# Patient Record
Sex: Female | Born: 1935 | Race: Black or African American | Hispanic: No | Smoking: Never smoker
Health system: Southern US, Community
[De-identification: ages and names within clinical notes are randomized; demographics above are authoritative.]

## PROBLEM LIST (undated history)

## (undated) DIAGNOSIS — I2510 Atherosclerotic heart disease of native coronary artery without angina pectoris: Secondary | ICD-10-CM

## (undated) DIAGNOSIS — I10 Essential (primary) hypertension: Secondary | ICD-10-CM

## (undated) HISTORY — PX: CARDIAC SURGERY: SHX584

---

## 2019-10-26 ENCOUNTER — Emergency Department (HOSPITAL_COMMUNITY): Payer: Medicare Other

## 2019-10-26 ENCOUNTER — Other Ambulatory Visit: Payer: Self-pay

## 2019-10-26 ENCOUNTER — Encounter (HOSPITAL_COMMUNITY): Payer: Self-pay | Admitting: *Deleted

## 2019-10-26 ENCOUNTER — Observation Stay (HOSPITAL_COMMUNITY)
Admission: EM | Admit: 2019-10-26 | Discharge: 2019-10-28 | Disposition: A | Discharge status: Home Health | Payer: Medicare Other | Attending: Internal Medicine | Admitting: Internal Medicine

## 2019-10-26 DIAGNOSIS — I351 Nonrheumatic aortic (valve) insufficiency: Secondary | ICD-10-CM | POA: Diagnosis not present

## 2019-10-26 DIAGNOSIS — I160 Hypertensive urgency: Secondary | ICD-10-CM | POA: Diagnosis not present

## 2019-10-26 DIAGNOSIS — R531 Weakness: Secondary | ICD-10-CM | POA: Insufficient documentation

## 2019-10-26 DIAGNOSIS — R2681 Unsteadiness on feet: Secondary | ICD-10-CM | POA: Diagnosis not present

## 2019-10-26 DIAGNOSIS — Z20822 Contact with and (suspected) exposure to covid-19: Secondary | ICD-10-CM | POA: Insufficient documentation

## 2019-10-26 DIAGNOSIS — I2510 Atherosclerotic heart disease of native coronary artery without angina pectoris: Secondary | ICD-10-CM | POA: Insufficient documentation

## 2019-10-26 DIAGNOSIS — N170 Acute kidney failure with tubular necrosis: Secondary | ICD-10-CM | POA: Insufficient documentation

## 2019-10-26 DIAGNOSIS — R519 Headache, unspecified: Secondary | ICD-10-CM | POA: Insufficient documentation

## 2019-10-26 DIAGNOSIS — I161 Hypertensive emergency: Secondary | ICD-10-CM

## 2019-10-26 DIAGNOSIS — Z79899 Other long term (current) drug therapy: Secondary | ICD-10-CM | POA: Diagnosis not present

## 2019-10-26 DIAGNOSIS — R002 Palpitations: Secondary | ICD-10-CM | POA: Diagnosis not present

## 2019-10-26 DIAGNOSIS — N183 Chronic kidney disease, stage 3 unspecified: Secondary | ICD-10-CM | POA: Insufficient documentation

## 2019-10-26 DIAGNOSIS — I129 Hypertensive chronic kidney disease with stage 1 through stage 4 chronic kidney disease, or unspecified chronic kidney disease: Secondary | ICD-10-CM | POA: Diagnosis not present

## 2019-10-26 DIAGNOSIS — Z8679 Personal history of other diseases of the circulatory system: Secondary | ICD-10-CM

## 2019-10-26 HISTORY — DX: Essential (primary) hypertension: I10

## 2019-10-26 HISTORY — DX: Atherosclerotic heart disease of native coronary artery without angina pectoris: I25.10

## 2019-10-26 LAB — CBC WITH DIFFERENTIAL/PLATELET
Abs Immature Granulocytes: 0.02 10*3/uL (ref 0.00–0.07)
Basophils Absolute: 0 10*3/uL (ref 0.0–0.1)
Basophils Relative: 0 %
Eosinophils Absolute: 0 10*3/uL (ref 0.0–0.5)
Eosinophils Relative: 0 %
HCT: 36.2 % (ref 36.0–46.0)
Hemoglobin: 11.4 g/dL — ABNORMAL LOW (ref 12.0–15.0)
Immature Granulocytes: 0 %
Lymphocytes Relative: 19 %
Lymphs Abs: 1.2 10*3/uL (ref 0.7–4.0)
MCH: 23.4 pg — ABNORMAL LOW (ref 26.0–34.0)
MCHC: 31.5 g/dL (ref 30.0–36.0)
MCV: 74.2 fL — ABNORMAL LOW (ref 80.0–100.0)
Monocytes Absolute: 0.3 10*3/uL (ref 0.1–1.0)
Monocytes Relative: 5 %
Neutro Abs: 4.8 10*3/uL (ref 1.7–7.7)
Neutrophils Relative %: 76 %
Platelets: 165 10*3/uL (ref 150–400)
RBC: 4.88 MIL/uL (ref 3.87–5.11)
RDW: 18.4 % — ABNORMAL HIGH (ref 11.5–15.5)
WBC: 6.3 10*3/uL (ref 4.0–10.5)
nRBC: 0 % (ref 0.0–0.2)

## 2019-10-26 LAB — COMPREHENSIVE METABOLIC PANEL
ALT: 14 U/L (ref 0–44)
AST: 20 U/L (ref 15–41)
Albumin: 3.7 g/dL (ref 3.5–5.0)
Alkaline Phosphatase: 63 U/L (ref 38–126)
Anion gap: 10 (ref 5–15)
BUN: 37 mg/dL — ABNORMAL HIGH (ref 8–23)
CO2: 27 mmol/L (ref 22–32)
Calcium: 9.5 mg/dL (ref 8.9–10.3)
Chloride: 105 mmol/L (ref 98–111)
Creatinine, Ser: 1.45 mg/dL — ABNORMAL HIGH (ref 0.44–1.00)
GFR calc Af Amer: 39 mL/min — ABNORMAL LOW (ref >60)
GFR calc non Af Amer: 33 mL/min — ABNORMAL LOW (ref >60)
Glucose, Bld: 120 mg/dL — ABNORMAL HIGH (ref 70–99)
Potassium: 3.5 mmol/L (ref 3.5–5.1)
Sodium: 142 mmol/L (ref 135–145)
Total Bilirubin: 1.2 mg/dL (ref 0.3–1.2)
Total Protein: 7.9 g/dL (ref 6.5–8.1)

## 2019-10-26 LAB — URINALYSIS, ROUTINE W REFLEX MICROSCOPIC
Bilirubin Urine: NEGATIVE
Glucose, UA: NEGATIVE mg/dL
Hgb urine dipstick: NEGATIVE
Ketones, ur: 5 mg/dL — AB
Nitrite: NEGATIVE
Protein, ur: NEGATIVE mg/dL
Specific Gravity, Urine: 1.004 — ABNORMAL LOW (ref 1.005–1.030)
pH: 5 (ref 5.0–8.0)

## 2019-10-26 LAB — BRAIN NATRIURETIC PEPTIDE
B Natriuretic Peptide: 155.6 pg/mL — ABNORMAL HIGH (ref 0.0–100.0)
B Natriuretic Peptide: 359.8 pg/mL — ABNORMAL HIGH (ref 0.0–100.0)

## 2019-10-26 LAB — RESPIRATORY PANEL BY RT PCR (FLU A&B, COVID)
Influenza A by PCR: NEGATIVE
Influenza B by PCR: NEGATIVE
SARS Coronavirus 2 by RT PCR: NEGATIVE

## 2019-10-26 LAB — LIPASE, BLOOD: Lipase: 21 U/L (ref 11–51)

## 2019-10-26 LAB — MAGNESIUM: Magnesium: 2.3 mg/dL (ref 1.7–2.4)

## 2019-10-26 LAB — PROTIME-INR
INR: 1 (ref 0.8–1.2)
INR: 1 (ref 0.8–1.2)
Prothrombin Time: 12.3 seconds (ref 11.4–15.2)
Prothrombin Time: 12.9 seconds (ref 11.4–15.2)

## 2019-10-26 LAB — TROPONIN I (HIGH SENSITIVITY)
Troponin I (High Sensitivity): 52 ng/L — ABNORMAL HIGH (ref <18)
Troponin I (High Sensitivity): 64 ng/L — ABNORMAL HIGH (ref <18)

## 2019-10-26 MED ORDER — FUROSEMIDE 40 MG PO TABS
40.0000 mg | ORAL_TABLET | Freq: Every day | ORAL | Status: DC
  Administered 2019-10-27 – 2019-10-28 (×2): 40 mg via ORAL
  Filled 2019-10-26: qty 2
  Filled 2019-10-26: qty 1

## 2019-10-26 MED ORDER — MORPHINE SULFATE (PF) 4 MG/ML IV SOLN: 4.0000 mg | Freq: Once | INTRAVENOUS | Status: DC

## 2019-10-26 MED ORDER — ACETAMINOPHEN 325 MG PO TABS: 650.0000 mg | ORAL_TABLET | Freq: Four times a day (QID) | ORAL | Status: DC | PRN

## 2019-10-26 MED ORDER — HYDRALAZINE HCL 50 MG PO TABS
50.0000 mg | ORAL_TABLET | Freq: Three times a day (TID) | ORAL | Status: DC
  Administered 2019-10-26 – 2019-10-28 (×6): 50 mg via ORAL
  Filled 2019-10-26: qty 1
  Filled 2019-10-26: qty 2
  Filled 2019-10-26 (×2): qty 1
  Filled 2019-10-26 (×2): qty 2

## 2019-10-26 MED ORDER — ACETAMINOPHEN 650 MG RE SUPP: 650.0000 mg | Freq: Four times a day (QID) | RECTAL | Status: DC | PRN

## 2019-10-26 MED ORDER — CLONIDINE HCL 0.2 MG PO TABS
0.2000 mg | ORAL_TABLET | Freq: Once | ORAL | Status: AC
  Administered 2019-10-26: 0.2 mg via ORAL
  Filled 2019-10-26: qty 1

## 2019-10-26 MED ORDER — AMLODIPINE BESYLATE 10 MG PO TABS
10.0000 mg | ORAL_TABLET | Freq: Every day | ORAL | Status: DC
  Administered 2019-10-26 – 2019-10-28 (×3): 10 mg via ORAL
  Filled 2019-10-26 (×2): qty 2
  Filled 2019-10-26: qty 1

## 2019-10-26 MED ORDER — ACETAMINOPHEN 500 MG PO TABS
1000.0000 mg | ORAL_TABLET | Freq: Once | ORAL | Status: AC
  Administered 2019-10-26: 1000 mg via ORAL
  Filled 2019-10-26: qty 2

## 2019-10-26 MED ORDER — NITROGLYCERIN IN D5W 200-5 MCG/ML-% IV SOLN
0.0000 ug/min | INTRAVENOUS | Status: DC
  Filled 2019-10-26: qty 250

## 2019-10-26 MED ORDER — HEPARIN SODIUM (PORCINE) 5000 UNIT/ML IJ SOLN
5000.0000 [IU] | Freq: Three times a day (TID) | INTRAMUSCULAR | Status: DC
  Administered 2019-10-26 – 2019-10-28 (×6): 5000 [IU] via SUBCUTANEOUS
  Filled 2019-10-26 (×6): qty 1

## 2019-10-26 MED ORDER — ONDANSETRON HCL 4 MG/2ML IJ SOLN
4.0000 mg | Freq: Four times a day (QID) | INTRAMUSCULAR | Status: DC | PRN
  Administered 2019-10-26: 4 mg via INTRAVENOUS
  Filled 2019-10-26: qty 2

## 2019-10-26 MED ORDER — HYDRALAZINE HCL 20 MG/ML IJ SOLN
20.0000 mg | Freq: Once | INTRAMUSCULAR | Status: AC
  Administered 2019-10-26: 20 mg via INTRAVENOUS
  Filled 2019-10-26: qty 1

## 2019-10-26 MED ORDER — HYDROCHLOROTHIAZIDE 25 MG PO TABS
25.0000 mg | ORAL_TABLET | Freq: Every day | ORAL | Status: DC
  Administered 2019-10-26 – 2019-10-27 (×2): 25 mg via ORAL
  Filled 2019-10-26 (×2): qty 1

## 2019-10-26 MED ORDER — NITROGLYCERIN IN D5W 200-5 MCG/ML-% IV SOLN: 0.0000 ug/min | INTRAVENOUS | Status: DC

## 2019-10-26 MED ORDER — IRBESARTAN 300 MG PO TABS
300.0000 mg | ORAL_TABLET | Freq: Every day | ORAL | Status: DC
  Administered 2019-10-27 – 2019-10-28 (×2): 300 mg via ORAL
  Filled 2019-10-26 (×2): qty 1

## 2019-10-26 MED ORDER — HYDROMORPHONE HCL 1 MG/ML IJ SOLN
1.0000 mg | Freq: Once | INTRAMUSCULAR | Status: AC
  Administered 2019-10-26: 1 mg via INTRAVENOUS
  Filled 2019-10-26: qty 1

## 2019-10-26 MED ORDER — MORPHINE SULFATE (PF) 4 MG/ML IV SOLN
4.0000 mg | Freq: Once | INTRAVENOUS | Status: AC
  Administered 2019-10-26: 4 mg via INTRAVENOUS
  Filled 2019-10-26: qty 1

## 2019-10-26 MED ORDER — ATORVASTATIN CALCIUM 40 MG PO TABS
40.0000 mg | ORAL_TABLET | Freq: Every day | ORAL | Status: DC
  Administered 2019-10-27 – 2019-10-28 (×2): 40 mg via ORAL
  Filled 2019-10-26 (×2): qty 1

## 2019-10-26 MED ORDER — HYDRALAZINE HCL 20 MG/ML IJ SOLN: 20.0000 mg | Freq: Four times a day (QID) | INTRAMUSCULAR | Status: DC | PRN

## 2019-10-26 MED ORDER — CLONIDINE HCL 0.1 MG PO TABS
0.1000 mg | ORAL_TABLET | Freq: Two times a day (BID) | ORAL | Status: DC
  Administered 2019-10-26 – 2019-10-28 (×4): 0.1 mg via ORAL
  Filled 2019-10-26 (×4): qty 1

## 2019-10-26 MED ORDER — LABETALOL HCL 5 MG/ML IV SOLN
10.0000 mg | INTRAVENOUS | Status: DC | PRN
  Administered 2019-10-26: 10 mg via INTRAVENOUS
  Filled 2019-10-26: qty 4

## 2019-10-26 MED ORDER — CLEVIDIPINE BUTYRATE 0.5 MG/ML IV EMUL
0.0000 mg/h | INTRAVENOUS | Status: DC
  Administered 2019-10-26: 1 mg/h via INTRAVENOUS
  Filled 2019-10-26: qty 50

## 2019-10-26 NOTE — ED Provider Notes (Signed)
[facility] EMERGENCY DEPARTMENT Provider Note   CSN: [id number] Arrival date & time: 10/26/19  1054     History Chief Complaint  Patient presents with  . Palpitations    Morgan Farmer is a 85 y.o. female.  HPI Patient's first complaint on arrival was for palpitations.  She reports she can hear feel her heart pounding hard and then racing at times.  She denies it is painful but she does perceive the sensation of pounding.  She denies being short of breath or feeling like she will pass out.  Patient was treated for hypertensive urgency at [facility] 5 days ago.  She was admitted overnight and put on nifedipine.  Patient reports she has been compliant with her medications since discharge.  Her daughter has come to the emergency department and reports that she has been taking her clonidine, hydralazine and valsartan as prescribed.  Patient reports she did take the a.m. doses of her medications as prescribed.  Shortly after initiating the patient's evaluation, she began complaining of a generalized headache.  She denied visual symptoms.  Her blood pressure continued to climb.  Patient had increasing complaint of headache.  She did not have associated nausea or vomiting.    Past Medical History:  Diagnosis Date  . Coronary artery disease   . Hypertension     There are no problems to display for this patient.      OB History   No obstetric history on file.     No family history on file.  Social History   Tobacco Use  . Smoking status: Never Smoker  . Smokeless tobacco: Never Used  Substance Use Topics  . Alcohol use: Never  . Drug use: Never    Home Medications Prior to Admission medications   Medication Sig Start Date End Date Taking? Authorizing Provider  ascorbic acid (VITAMIN C) 500 MG tablet Take 500-1,000 mg by mouth daily.   Yes [provider]  Cholecalciferol (VITAMIN D-3) 25 MCG (1000 UT) CAPS Take 1,000 Units by  mouth daily.   Yes [provider]  cloNIDine (CATAPRES) 0.1 MG tablet Take 0.1 mg by mouth 2 (two) times daily.   Yes [provider]  GARLIC PO Take 1 tablet by mouth daily.   Yes [provider]  hydrALAZINE (APRESOLINE) 50 MG tablet Take 50 mg by mouth 3 (three) times daily.   Yes [provider]  valsartan (DIOVAN) 320 MG tablet Take 320 mg by mouth daily.   Yes [provider]    Allergies    Patient has no known allergies.  Review of Systems   Review of Systems 10 Systems reviewed and are negative for acute change except as noted in the HPI.  Physical Exam Updated Vital Signs BP (!) 183/66   Pulse 69   Temp 98.2 F (36.8 C) (Oral)   Resp 14   Ht 5\' 2"  (1.575 m)   Wt 45.4 kg   SpO2 99%   BMI 18.29 kg/m   Physical Exam Constitutional:      Comments: Alert.  Mental status clear.  No respiratory distress at rest.  Very thin in appearance.  HENT:     Head: Normocephalic and atraumatic.     Mouth/Throat:     Mouth: Mucous membranes are moist.     Pharynx: Oropharynx is clear.     Comments: Poor dentition. Eyes:     Extraocular Movements: Extraocular movements intact.     Pupils: Pupils  are equal, round, and reactive to light.     Comments: Pupils are approximately 2 to 3 mm and symmetric.  Cardiovascular:     Comments: Heart regular with frequent ectopy.  Monitor shows sinus rhythm with combination of PACs and PVCs intermittently. Pulmonary:     Effort: Pulmonary effort is normal.     Breath sounds: Normal breath sounds.  Abdominal:     General: There is no distension.     Palpations: Abdomen is soft.     Tenderness: There is no abdominal tenderness. There is no guarding.  Musculoskeletal:        General: No swelling or tenderness. Normal range of motion.     Cervical back: Neck supple.     Right lower leg: No edema.     Left lower leg: No edema.  Skin:    General: Skin is warm and dry.  Neurological:     General:  No focal deficit present.     Mental Status: She is oriented to person, place, and time.     Cranial Nerves: No cranial nerve deficit.     Motor: No weakness.     Coordination: Coordination normal.  Psychiatric:        Mood and Affect: Mood normal.     ED Results / Procedures / Treatments   Labs (all labs ordered are listed, but only abnormal results are displayed) Labs Reviewed  BRAIN NATRIURETIC PEPTIDE - Abnormal; Notable for the following components:      Result Value   B Natriuretic Peptide 155.6 (*)    All other components within normal limits  CBC WITH DIFFERENTIAL/PLATELET - Abnormal; Notable for the following components:   Hemoglobin 11.4 (*)    MCV 74.2 (*)    MCH 23.4 (*)    RDW 18.4 (*)    All other components within normal limits  URINALYSIS, ROUTINE W REFLEX MICROSCOPIC - Abnormal; Notable for the following components:   Color, Urine STRAW (*)    Specific Gravity, Urine 1.004 (*)    Ketones, ur 5 (*)    Leukocytes,Ua TRACE (*)    Bacteria, UA RARE (*)    All other components within normal limits  BRAIN NATRIURETIC PEPTIDE - Abnormal; Notable for the following components:   B Natriuretic Peptide 359.8 (*)    All other components within normal limits  TROPONIN I (HIGH SENSITIVITY) - Abnormal; Notable for the following components:   Troponin I (High Sensitivity) 52 (*)    All other components within normal limits  RESPIRATORY PANEL BY RT PCR (FLU A&B, COVID)  PROTIME-INR  PROTIME-INR  COMPREHENSIVE METABOLIC PANEL  LIPASE, BLOOD  MAGNESIUM  TROPONIN I (HIGH SENSITIVITY)    EKG EKG Interpretation  Date/Time:  Wednesday October 26 2019 10:57:53 EDT Ventricular Rate:  83 PR Interval:    QRS Duration: 94 QT Interval:  372 QTC Calculation: 438 R Axis:   8 Text Interpretation: Sinus rhythm Supraventricular bigeminy Prolonged PR interval Anterior infarct, old no old comparison, no STEMI Confirmed by Arby Barrette [phone]) on 10/26/2019 2:37:56  PM   Radiology DG Chest Port 1 View  Result Date: 10/26/2019 CLINICAL DATA:  Shortness of breath, headache EXAM: PORTABLE CHEST 1 VIEW COMPARISON:  CT a chest 10/21/2019 FINDINGS: Heart size is markedly enlarged, accentuated by portable technique. Dilation of the ascending thoracic aorta with tortuosity of descending aorta similar to previous exam. Osteopenia. Glenohumeral degenerative changes. No dense consolidation, no sign of pleural effusion. IMPRESSION: 1. No active cardiopulmonary disease. 2. Stable cardiomegaly.  Electronically Signed   By: Donzetta Kohut M.D.   On: 10/26/2019 12:37    Procedures Procedures (including critical care time) CRITICAL CARE Performed by: Arby Barrette   Total critical care time: 60 minutes  Critical care time was exclusive of separately billable procedures and treating other patients.  Critical care was necessary to treat or prevent imminent or life-threatening deterioration.  Critical care was time spent personally by me on the following activities: development of treatment plan with patient and/or surrogate as well as nursing, discussions with consultants, evaluation of patient's response to treatment, examination of patient, obtaining history from patient or surrogate, ordering and performing treatments and interventions, ordering and review of laboratory studies, ordering and review of radiographic studies, pulse oximetry and re-evaluation of patient's condition.  Bedside monitor: Sinus rhythm with frequent PACs and intermittent PVCs.  Rates ranging from 70s to low 100s Medications Ordered in ED Medications  clevidipine (CLEVIPREX) infusion 0.5 mg/mL (1 mg/hr Intravenous New Bag/Given 10/26/19 1533)  hydrALAZINE (APRESOLINE) injection 20 mg (20 mg Intravenous Given 10/26/19 1449)  cloNIDine (CATAPRES) tablet 0.2 mg (0.2 mg Oral Given 10/26/19 1448)  acetaminophen (TYLENOL) tablet 1,000 mg (1,000 mg Oral Given 10/26/19 1448)  morphine 4 MG/ML injection  4 mg (4 mg Intravenous Given 10/26/19 1532)    ED Course  I have reviewed the triage vital signs and the nursing notes.  Pertinent labs & imaging results that were available during my care of the patient were reviewed by me and considered in my medical decision making (see chart for details).  Clinical Course as of Oct 26 1614  Wed Oct 26, 2019  1458 Blood pressure has been steadily increasing.  Last pressure 241/91.  Patient reports she does have a generalized headache.  She reports she can still feel her heart racing.  No chest pain.  She is not confused.   [MP]  1521 Blood pressure has continued to elevate.  There is been no response to hydralazine.  Patient reports her headache is getting worse.  Will initiate Cleviprex.  Mental status remains clear.  Patient denies visual complaints.   [MP]    Clinical Course User Index [MP] Arby Barrette, MD   MDM Rules/Calculators/A&P                      Consult: Intensivist service for evaluation of hypertensive urgency\emergency with headache on Cleviprex.  Patient presents as aligned above.  She has history of aortic dissection.  CT angiogram was obtained 4 days ago at regional hospital when patient presented there.  No acute findings were identified.  Patient does not have any complaints today of chest pain or abdominal pain.  CT angiograms not repeated for chest and abdomen.  Patient initially complained predominantly of palpitations.  Over a fairly short period of time, her blood pressure began to elevate to 240s/90s.  Patient was given a IV dose of hydralazine and an oral dose of clonidine (home medications).  There was no improvement and patient complained of worsening headache.  Cleviprex initiated.  Patient also given a dose of Tylenol and morphine for pain control.  Multiple rechecks done.  Patient is not endorsing any significant improvement in her headache with pressures down to systolics of 150s to 180s.  Will add CT head.  Intensivist  team has been consulted for evaluation of the patient for admission. Final Clinical Impression(s) / ED Diagnoses Final diagnoses:  Hypertensive emergency without congestive heart failure  Heart palpitations  Bad headache  Rx / DC Orders ED Discharge Orders    None       Charlesetta Shanks, MD 10/26/19 1626

## 2019-10-26 NOTE — H&P (Signed)
Date: 10/26/2019               Patient Name:  Morgan Farmer MRN: [id number]  DOB: 1935/04/18 Age / Sex: 85 y.o., female   PCP: Patient, No Pcp Per         Medical Service: Internal Medicine Teaching Service         Attending Physician: Dr. Sandre Kitty, Elwin Mocha, MD    First Contact: Dr. Mcarthur Rossetti Pager: [phone]  Second Contact: Dr. Maryla Morrow Pager: [phone]       After Hours (After 5p/  First Contact Pager: [phone]  weekends / holidays): Second Contact Pager: [phone]   Chief Complaint: Headache, N/V  History of Present Illness:   Morgan Farmer is an 85 yo F w/ a PMHx of aortic dissection s/p repair, aortic insufficiency, malignant hypertension and CKD Stage III here with symptomatic hypertension.  Hx per patient, chart review and patient's daughter. Patient a a poor historian.   Per the pt, she is coming in for elevated blood pressure. She reports having an accompanying headache. Her headache was not present on admission and is a 5/10 in severity and getting worse. She describes it as holocranial. She denies sensitivity to sound and light. She notes she was recently in the hospital with hypertension and has been taking her blood pressure since discharge but can't remember what they've been. She lives alone and manages her own medications. She says there are ten but cannot remember what they are. She denies fevers, chills, sore throat, vision changes, chest pain, SOB, abdominal pain, dysuria, falls and LOC.  Per patient's daughter, pt's BP was over 200 a week or so ago so they made an appointment with her PCP. At the PCP's office the BP was 260 so she was given a medicine which did not impact the pressure at all so the pt was instructed to go to the ED. She was treated and discharged. Daughter states she is prescribed 7 medications and she's not sure her mother takes them all. She thinks she may take three. She says she has been on her mother about taking all her medications and is hopeful  this hospitalization will teach her the importance of that. We discussed it may be time to help assist her mother in managing her medications or have someone come in the home to help. She agreed and stated she was looking into home health organizations. She states her mother was taking her BP since their visit to the ER in [facility] and it has been around 160. Today it was 180 and the patient had trouble breathing and myalgias and so they called 911 to bring her in.   Per chart review pt was treated with hydral at the PCP's office with no response. At the ER she was treated with nicardipine with improvement in her pressures and instructed to follow up with her PCP in one week.  Her BP medications include: Hydral, HCTZ, amlodipine, clonidine, valsartan and lasix.   Meds:  Current Meds  Medication Sig  . ascorbic acid (VITAMIN C) 500 MG tablet Take 500-1,000 mg by mouth daily.  . Cholecalciferol (VITAMIN D-3) 25 MCG (1000 UT) CAPS Take 1,000 Units by mouth daily.  . cloNIDine (CATAPRES) 0.1 MG tablet Take 0.1 mg by mouth 2 (two) times daily.  Marland Kitchen GARLIC PO Take 1 tablet by mouth daily.  . hydrALAZINE (APRESOLINE) 50 MG tablet Take 50 mg by mouth 3 (three) times daily.  . valsartan (DIOVAN) 320 MG tablet  Take 320 mg by mouth daily.  . [DISCONTINUED] amLODipine (NORVASC) 10 MG tablet Take 10 mg by mouth daily.   Allergies: Allergies as of 10/26/2019  . (No Known Allergies)   Past Medical History:  Diagnosis Date  . Coronary artery disease   . Hypertension    Past Surgical History:  Procedure Laterality Date  . CARDIAC SURGERY     Family History: No family history on file.  Mother had hypertension  Social History:  Social History   Tobacco Use  . Smoking status: Never Smoker  . Smokeless tobacco: Never Used  Substance Use Topics  . Alcohol use: Never  . Drug use: Never  Lives alone Does not smoke Drinks rarely Does not use drugs  Review of Systems: A complete ROS was  negative except as per HPI.   Physical Exam: Blood pressure (!) 164/70, pulse 74, temperature 98.2 F (36.8 C), temperature source Oral, resp. rate 10, height 5\' 2"  (1.575 m), weight 45.4 kg, SpO2 94 %. Physical Exam  Constitutional: No distress.  HENT:  Head: Normocephalic and atraumatic.  Eyes: Pupils are equal, round, and reactive to light. EOM are normal.  Cardiovascular: Normal rate, normal heart sounds and intact distal pulses.  No murmur heard. Irregular rhythm; no LE edema  Pulmonary/Chest: Effort normal and breath sounds normal. No respiratory distress. She exhibits no tenderness.  Abdominal: Soft. Bowel sounds are normal. She exhibits no distension. There is no abdominal tenderness.  Musculoskeletal:        General: No edema. Normal range of motion.  Neurological: She is alert. No cranial nerve deficit.  Symmetric strength and sensation; no focal neurologic deficits  Skin: Skin is warm and dry. She is not diaphoretic. No erythema.  Psychiatric: Affect normal.  Impaired memory  Nursing note and vitals reviewed.  Labs: Results for orders placed or performed during the hospital encounter of 10/26/19 (from the past 24 hour(s))  Brain natriuretic peptide     Status: Abnormal   Collection Time: 10/26/19 12:10 PM  Result Value Ref Range   B Natriuretic Peptide 155.6 (H) 0.0 - 100.0 pg/mL  CBC with Differential     Status: Abnormal   Collection Time: 10/26/19 12:10 PM  Result Value Ref Range   WBC 6.3 4.0 - 10.5 K/uL   RBC 4.88 3.87 - 5.11 MIL/uL   Hemoglobin 11.4 (L) 12.0 - 15.0 g/dL   HCT 10/28/19 27.7 - 41.2 %   MCV 74.2 (L) 80.0 - 100.0 fL   MCH 23.4 (L) 26.0 - 34.0 pg   MCHC 31.5 30.0 - 36.0 g/dL   RDW 87.8 (H) 67.6 - 72.0 %   Platelets 165 150 - 400 K/uL   nRBC 0.0 0.0 - 0.2 %   Neutrophils Relative % 76 %   Neutro Abs 4.8 1.7 - 7.7 K/uL   Lymphocytes Relative 19 %   Lymphs Abs 1.2 0.7 - 4.0 K/uL   Monocytes Relative 5 %   Monocytes Absolute 0.3 0.1 - 1.0 K/uL    Eosinophils Relative 0 %   Eosinophils Absolute 0.0 0.0 - 0.5 K/uL   Basophils Relative 0 %   Basophils Absolute 0.0 0.0 - 0.1 K/uL   Immature Granulocytes 0 %   Abs Immature Granulocytes 0.02 0.00 - 0.07 K/uL  Protime-INR     Status: None   Collection Time: 10/26/19 12:10 PM  Result Value Ref Range   Prothrombin Time 12.3 11.4 - 15.2 seconds   INR 1.0 0.8 - 1.2  Urinalysis, Routine w  reflex microscopic     Status: Abnormal   Collection Time: 10/26/19  1:15 PM  Result Value Ref Range   Color, Urine STRAW (A) YELLOW   APPearance CLEAR CLEAR   Specific Gravity, Urine 1.004 (L) 1.005 - 1.030   pH 5.0 5.0 - 8.0   Glucose, UA NEGATIVE NEGATIVE mg/dL   Hgb urine dipstick NEGATIVE NEGATIVE   Bilirubin Urine NEGATIVE NEGATIVE   Ketones, ur 5 (A) NEGATIVE mg/dL   Protein, ur NEGATIVE NEGATIVE mg/dL   Nitrite NEGATIVE NEGATIVE   Leukocytes,Ua TRACE (A) NEGATIVE   WBC, UA 0-5 0 - 5 WBC/hpf   Bacteria, UA RARE (A) NONE SEEN   Squamous Epithelial / LPF 0-5 0 - 5  Brain natriuretic peptide     Status: Abnormal   Collection Time: 10/26/19  2:33 PM  Result Value Ref Range   B Natriuretic Peptide 359.8 (H) 0.0 - 100.0 pg/mL  Troponin I (High Sensitivity)     Status: Abnormal   Collection Time: 10/26/19  2:33 PM  Result Value Ref Range   Troponin I (High Sensitivity) 52 (H) <18 ng/L  Protime-INR     Status: None   Collection Time: 10/26/19  2:33 PM  Result Value Ref Range   Prothrombin Time 12.9 11.4 - 15.2 seconds   INR 1.0 0.8 - 1.2  Respiratory Panel by RT PCR (Flu A&B, Covid) - Nasopharyngeal Swab     Status: None   Collection Time: 10/26/19  4:32 PM   Specimen: Nasopharyngeal Swab  Result Value Ref Range   SARS Coronavirus 2 by RT PCR NEGATIVE NEGATIVE   Influenza A by PCR NEGATIVE NEGATIVE   Influenza B by PCR NEGATIVE NEGATIVE  Comprehensive metabolic panel     Status: Abnormal   Collection Time: 10/26/19  5:58 PM  Result Value Ref Range   Sodium 142 135 - 145 mmol/L    Potassium 3.5 3.5 - 5.1 mmol/L   Chloride 105 98 - 111 mmol/L   CO2 27 22 - 32 mmol/L   Glucose, Bld 120 (H) 70 - 99 mg/dL   BUN 37 (H) 8 - 23 mg/dL   Creatinine, Ser 1.45 (H) 0.44 - 1.00 mg/dL   Calcium 9.5 8.9 - 10.3 mg/dL   Total Protein 7.9 6.5 - 8.1 g/dL   Albumin 3.7 3.5 - 5.0 g/dL   AST 20 15 - 41 U/L   ALT 14 0 - 44 U/L   Alkaline Phosphatase 63 38 - 126 U/L   Total Bilirubin 1.2 0.3 - 1.2 mg/dL   GFR calc non Af Amer 33 (L) >60 mL/min   GFR calc Af Amer 39 (L) >60 mL/min   Anion gap 10 5 - 15  Lipase, blood     Status: None   Collection Time: 10/26/19  5:58 PM  Result Value Ref Range   Lipase 21 11 - 51 U/L  Magnesium     Status: None   Collection Time: 10/26/19  5:58 PM  Result Value Ref Range   Magnesium 2.3 1.7 - 2.4 mg/dL  Troponin I (High Sensitivity)     Status: Abnormal   Collection Time: 10/26/19  5:58 PM  Result Value Ref Range   Troponin I (High Sensitivity) 64 (H) <18 ng/L   EKG: personally reviewed my interpretation is no acute ischemic changes  CXR: personally reviewed my interpretation is no acute cardiopulmonary abnormality   CT Head Wo Contrast  Result Date: 10/26/2019 CLINICAL DATA:  Elevated blood pressure. Headache. EXAM: CT HEAD WITHOUT  CONTRAST TECHNIQUE: Contiguous axial images were obtained from the base of the skull through the vertex without intravenous contrast. COMPARISON:  None. FINDINGS: Brain: Periventricular and subcortical white matter hypodensities compatible with chronic microvascular ischemic changes. No evidence for acute cortically based infarct, intracranial hemorrhage or mass-effect. Within the left posterior fossa there is a 2.3 x 1.4 cm calcified oval mass with minimal mass effect on the left cerebellar hemisphere (image 6; series 2). Vascular: Unremarkable Skull: Intact. Sinuses/Orbits: Paranasal sinuses are well aerated. Mastoid air cells are unremarkable. Orbits are unremarkable. Other: None. IMPRESSION: 1. No acute intracranial  process. 2. Atrophy and chronic microvascular ischemic changes. 3. There is a 2.3 cm partially calcified meningioma within the left aspect of the posterior fossa. Electronically Signed   By: Annia Belt M.D.   On: 10/26/2019 19:19   DG Chest Port 1 View  Result Date: 10/26/2019 CLINICAL DATA:  Shortness of breath, headache EXAM: PORTABLE CHEST 1 VIEW COMPARISON:  CT a chest 10/21/2019 FINDINGS: Heart size is markedly enlarged, accentuated by portable technique. Dilation of the ascending thoracic aorta with tortuosity of descending aorta similar to previous exam. Osteopenia. Glenohumeral degenerative changes. No dense consolidation, no sign of pleural effusion. IMPRESSION: 1. No active cardiopulmonary disease. 2. Stable cardiomegaly. Electronically Signed   By: Donzetta Kohut M.D.   On: 10/26/2019 12:37   Assessment:  Ms. Campus is an 85 yo F w/ a PMHx of aortic dissection s/p repair, aortic insufficiency, malignant hypertension and CKD Stage III here with symptomatic hypertension.  Plan by Problem:  Hypertensive Urgency: -pt with hx of malignant hypertension, aortic dissection s/p repair and poor medication adherence presenting with SOB found to be hypertensive to the 240s -recently seen in [facility] ED for hypertensive urgency and was treated with nicardipine -pt on Hydral, HCTZ, amlodipine, clonidine, valsartan and lasix but likely is not adherent, probably due to memory issues -pt lives alone and manages her own medications and thinks she may be on 10 but doesn't know the names -daughter thinks she takes 7 and that the mother probably at least takes 3 of them a day but she's not sure which ones -since ED visit her BP has been around 160 systolic and was 676 systolic this morning with accompanying SOB so they called 911 -in ED pt initially reported headache, N/V -chest x-ray unremarkable -CT head unremarkable (small meningioma without mass affect) -BNP 155 --> 359  -Trops 52 --> 64 (likely  demand) -Creatinine 1.45 (baseline around 1.2) -EKG without acute ischemic changes -treated in ED with home meds and cleviprex drip; PCCM evaluated and dc'ed cleviprex drip recommending providing patient's home meds and keeping systolics between 150-170; ED provider ordered nitro drip which we dc'ed and asked nursing to give a prn labetalol instead -creatinine only mildly bumped so hopefully this doesn't represent hypertensive emergency -this is likely a hypertensive urgency secondary to medication nonadherence due to patient's memory problems -may need to consider secondary causes of hypertension although this is likely just due to nonadherence   Plan: -continue home meds -prn hydral and labetalol -telemetry -progressive unit -consider additional memory/dementia eval -PT/OT eval -monitor I/O -daily weights  -monitor creatinine  -avoid nephrotoxic medications  Active Problems:   Hypertensive urgency   Dispo: Admit patient to Inpatient with expected length of stay greater than 2 midnights.  Signed: Jenell Milliner, MD 10/26/2019, 10:09 PM  Pager: [phone]

## 2019-10-26 NOTE — Consult Note (Addendum)
NAME:  Morgan Farmer, MRN:  [id number], DOB:  Jan 01, 1935, LOS: 0 ADMISSION DATE:  10/26/2019, CONSULTATION DATE:  10/26/2019 REFERRING MD:  Dr. Johnney Killian, CHIEF COMPLAINT:  Hypertension    History of present illness   85 year old female presents to ED with reported Hypertension. Patient states she takes BP everyday at home and it was reading 191Y systolic. Of noted patient was at [facility] 4/22-4/23 with Hypertensive Urgency, patient stated she ran out of her hydralazine was was unable to take for a few days. Patient is a poor historian. Lives at home. Daughter at bedside.   On arrival to ED patient with palpations and Hypertension. Of note patient has not taken Norvasc, Hydrochlorothiazide, or lasix since discharge from Hospital on 4/23. Highest BP reading since arrival 230/64. Patient is complaining of headache and nausea. Received 20 mg Hydralazine and 0.2 mg Clonidine, then placed on Cleviprex gtt at 1 mg. BP on assessment is 782 systolic. Critical Care Consulted.   Past Medical History  Type A Aortic Dissection s/p Root Repair 2011. HTN. CAD  Significant Hospital Events   4/28 > Presents to ED   Consults:  PCCM   Procedures:  N/A  Significant Diagnostic Tests:  CT Head 4/28 >>  Micro Data:  N/A  Antimicrobials:  N/A    Interim history/subjective:  As above   Objective   Blood pressure (!) 145/52, pulse 80, temperature 98.2 F (36.8 C), temperature source Oral, resp. rate 15, height 5\' 2"  (1.575 m), weight 45.4 kg, SpO2 98 %.       No intake or output data in the 24 hours ending 10/26/19 1655 Filed Weights   10/26/19 1241  Weight: 45.4 kg    Examination: General: Elderly Female, no distress HENT: Dry MM  Lungs: Clear breath sounds Cardiovascular: Tachy, no MRG Abdomen: Soft, non-tender, active bowel sounds  Extremities: +1 BLE edema  Neuro: alert, oriented, follows commands  GU: intact   Resolved Hospital Problem list     Assessment & Plan:   Hypertensive  Urgency -Patient has missed several BP medications since discharge 4/23 including Norvasc,, Hydrochlorothiazide, or lasix  H/O Malignant HTN Plan  -Re-start Home Medications  -BP goal <180 (Currently 170 off Cleviprex gtt)  -PRN Labetalol/Hydralazine  -Obtain Head CT  -Will need Discharge Education regarding home medications  Nausea  Plan  -PRN Zofran   H/O Type A Dissection s/p Repair 2011 -CT Scan 4/22 stable   Patient with no critical care needs at this time. Can be admitted to progressive/medical bed.   Best practice:  Code Status: Full Code  Family Communication: Daughter Updated at bedside   Labs   CBC: Recent Labs  Lab 10/26/19 1210  WBC 6.3  NEUTROABS 4.8  HGB 11.4*  HCT 36.2  MCV 74.2*  PLT 956    Basic Metabolic Panel: No results for input(s): NA, K, CL, CO2, GLUCOSE, BUN, CREATININE, CALCIUM, MG, PHOS in the last 168 hours. GFR: CrCl cannot be calculated (No successful lab value found.). Recent Labs  Lab 10/26/19 1210  WBC 6.3    Liver Function Tests: No results for input(s): AST, ALT, ALKPHOS, BILITOT, PROT, ALBUMIN in the last 168 hours. No results for input(s): LIPASE, AMYLASE in the last 168 hours. No results for input(s): AMMONIA in the last 168 hours.  ABG No results found for: PHART, PCO2ART, PO2ART, HCO3, TCO2, ACIDBASEDEF, O2SAT   Coagulation Profile: Recent Labs  Lab 10/26/19 1210 10/26/19 1433  INR 1.0 1.0    Cardiac Enzymes: No  results for input(s): CKTOTAL, CKMB, CKMBINDEX, TROPONINI in the last 168 hours.  HbA1C: No results found for: HGBA1C  CBG: No results for input(s): GLUCAP in the last 168 hours.  Review of Systems:   +Nausea, +Headache. Denies Chest Pain/Pressure. Denies Fever, Chills   Past Medical History  She,  has a past medical history of Coronary artery disease and Hypertension.   Surgical History    Past Surgical History:  Procedure Laterality Date  . CARDIAC SURGERY       Social History    reports that she has never smoked. She has never used smokeless tobacco. She reports that she does not drink alcohol or use drugs.   Family History   Her family history is not on file.   Allergies No Known Allergies   Home Medications  Prior to Admission medications   Medication Sig Start Date End Date Taking? Authorizing Provider  ascorbic acid (VITAMIN C) 500 MG tablet Take 500-1,000 mg by mouth daily.   Yes [provider]  Cholecalciferol (VITAMIN D-3) 25 MCG (1000 UT) CAPS Take 1,000 Units by mouth daily.   Yes [provider]  cloNIDine (CATAPRES) 0.1 MG tablet Take 0.1 mg by mouth 2 (two) times daily.   Yes [provider]  GARLIC PO Take 1 tablet by mouth daily.   Yes [provider]  hydrALAZINE (APRESOLINE) 50 MG tablet Take 50 mg by mouth 3 (three) times daily.   Yes [provider]  valsartan (DIOVAN) 320 MG tablet Take 320 mg by mouth daily.   Yes [provider]    Jovita Kussmaul, AGACNP-BC [facility]  Pgr: [phone]  PCCM Pgr: [phone]

## 2019-10-26 NOTE — ED Triage Notes (Signed)
Pt had at 0900 palpitations, became anxious and called EMS.  Recent HTN hospitalization, and was released on Friday. Pt bp 180/90. New afib onset 80-120

## 2019-10-26 NOTE — ED Provider Notes (Signed)
Pt signed out by Dr. Donnald Garre awaiting symptomatic BP improvement and ICU recommendations.  Dr. Katrinka Blazing (CCM) took pt off the cleviprex and ordered her normal home bp meds which she's not been taking.  CT head ordered and showed:   IMPRESSION: 1. No acute intracranial process. 2. Atrophy and chronic microvascular ischemic changes. 3. There is a 2.3 cm partially calcified meningioma within the left aspect of the posterior fossa.  Pt still with a headache.  Morphine did not help.  Dilaudid ordered.  Additional bp meds ordered.  Pt d/w IMTS for admission as CCM did not feel she needed ICU.   Jacalyn Lefevre, MD 10/26/19 2033

## 2019-10-27 ENCOUNTER — Encounter (HOSPITAL_COMMUNITY): Payer: Self-pay | Admitting: Internal Medicine

## 2019-10-27 ENCOUNTER — Inpatient Hospital Stay (HOSPITAL_BASED_OUTPATIENT_CLINIC_OR_DEPARTMENT_OTHER): Payer: Medicare Other

## 2019-10-27 DIAGNOSIS — I160 Hypertensive urgency: Secondary | ICD-10-CM | POA: Diagnosis not present

## 2019-10-27 DIAGNOSIS — E785 Hyperlipidemia, unspecified: Secondary | ICD-10-CM

## 2019-10-27 DIAGNOSIS — R002 Palpitations: Secondary | ICD-10-CM | POA: Diagnosis not present

## 2019-10-27 DIAGNOSIS — I361 Nonrheumatic tricuspid (valve) insufficiency: Secondary | ICD-10-CM

## 2019-10-27 DIAGNOSIS — Z8679 Personal history of other diseases of the circulatory system: Secondary | ICD-10-CM

## 2019-10-27 DIAGNOSIS — Z9889 Other specified postprocedural states: Secondary | ICD-10-CM

## 2019-10-27 DIAGNOSIS — I340 Nonrheumatic mitral (valve) insufficiency: Secondary | ICD-10-CM

## 2019-10-27 DIAGNOSIS — R011 Cardiac murmur, unspecified: Secondary | ICD-10-CM

## 2019-10-27 DIAGNOSIS — N183 Chronic kidney disease, stage 3 unspecified: Secondary | ICD-10-CM | POA: Diagnosis not present

## 2019-10-27 DIAGNOSIS — I129 Hypertensive chronic kidney disease with stage 1 through stage 4 chronic kidney disease, or unspecified chronic kidney disease: Secondary | ICD-10-CM

## 2019-10-27 DIAGNOSIS — I351 Nonrheumatic aortic (valve) insufficiency: Secondary | ICD-10-CM | POA: Diagnosis present

## 2019-10-27 LAB — BASIC METABOLIC PANEL
Anion gap: 13 (ref 5–15)
BUN: 35 mg/dL — ABNORMAL HIGH (ref 8–23)
CO2: 23 mmol/L (ref 22–32)
Calcium: 9 mg/dL (ref 8.9–10.3)
Chloride: 104 mmol/L (ref 98–111)
Creatinine, Ser: 1.44 mg/dL — ABNORMAL HIGH (ref 0.44–1.00)
GFR calc Af Amer: 39 mL/min — ABNORMAL LOW (ref >60)
GFR calc non Af Amer: 34 mL/min — ABNORMAL LOW (ref >60)
Glucose, Bld: 87 mg/dL (ref 70–99)
Potassium: 3.8 mmol/L (ref 3.5–5.1)
Sodium: 140 mmol/L (ref 135–145)

## 2019-10-27 LAB — CBG MONITORING, ED: Glucose-Capillary: 78 mg/dL (ref 70–99)

## 2019-10-27 LAB — CBC
HCT: 34.4 % — ABNORMAL LOW (ref 36.0–46.0)
Hemoglobin: 10.7 g/dL — ABNORMAL LOW (ref 12.0–15.0)
MCH: 23.3 pg — ABNORMAL LOW (ref 26.0–34.0)
MCHC: 31.1 g/dL (ref 30.0–36.0)
MCV: 74.9 fL — ABNORMAL LOW (ref 80.0–100.0)
Platelets: 171 10*3/uL (ref 150–400)
RBC: 4.59 MIL/uL (ref 3.87–5.11)
RDW: 16.6 % — ABNORMAL HIGH (ref 11.5–15.5)
WBC: 4.7 10*3/uL (ref 4.0–10.5)
nRBC: 0 % (ref 0.0–0.2)

## 2019-10-27 MED ORDER — ATORVASTATIN CALCIUM 20 MG PO TABS
20.0000 mg | ORAL_TABLET | Freq: Every day | ORAL | 0 refills | Status: AC
Start: 2019-10-27 — End: 2019-11-26

## 2019-10-27 MED ORDER — HYDROCHLOROTHIAZIDE 12.5 MG PO TABS
25.0000 mg | ORAL_TABLET | Freq: Every day | ORAL | 0 refills | Status: AC
Start: 2019-10-27 — End: 2019-11-26

## 2019-10-27 MED ORDER — FUROSEMIDE 40 MG PO TABS: 40.0000 mg | ORAL_TABLET | Freq: Every day | ORAL | 0 refills | Status: AC

## 2019-10-27 MED ORDER — AMLODIPINE BESYLATE 10 MG PO TABS: 10.0000 mg | ORAL_TABLET | Freq: Every day | ORAL | 0 refills | Status: AC

## 2019-10-27 NOTE — ED Notes (Signed)
The pt opened her eyes for a moment  Closed her eyes again  No complaints of pain

## 2019-10-27 NOTE — Care Management Obs Status (Signed)
MEDICARE OBSERVATION STATUS NOTIFICATION   Patient Details  Name: Morgan Farmer MRN: [id number] Date of Birth: 1935-05-04   Medicare Observation Status Notification Given:   Yes    Dawnna Gritz, LCSW 10/27/2019, 5:32 PM

## 2019-10-27 NOTE — Plan of Care (Signed)
  Problem: Education: Goal: Knowledge of General Education information will improve Description: Including pain rating scale, medication(s)/side effects and non-pharmacologic comfort measures Outcome: Progressing  Discussed plan to adjust blood pressure medications.  Problem: Activity: Goal: Risk for activity intolerance will decrease Outcome: Progressing  Patient transferring from stretcher to bed with no c/o SOB.

## 2019-10-27 NOTE — Progress Notes (Signed)
Reviewed the echo cardiogram and the clinical data and called the primary service. The changes on the echocardiogram are probably chronic and related to her previous surgery (she appears to have a resuspended/repaired aortic valve in the replaced ascending aorta). There is a hyperechoic/calcified nodule at the commissure between the right and non-coronary cusps, close to the aortic annulus. The degree of aortic insufficiency is identical to the echo report from 03/23/2019 from [facility]. There are no clinical or lab findings that suggest endocarditis. I do not think additional evaluation is necessary for endocarditis. It would be most useful to compare the current images to the previous echo, side-by-side. We would be glad to provide a CD of the study to her primary Cardiologist.

## 2019-10-27 NOTE — Progress Notes (Signed)
PT Cancellation Note  Patient Details Name: Jadelynn Boylan MRN: [id number] DOB: 1935-04-03   Cancelled Treatment:    Reason Eval/Treat Not Completed: Active bedrest order Will follow up as activity orders updated and follow up as schedule allows.   Farley Ly, PT, DPT  Acute Rehabilitation Services  Pager: [phone] Office: [phone]    Lehman Prom 10/27/2019, 8:08 AM

## 2019-10-27 NOTE — ED Notes (Signed)
Pt sleeping. 

## 2019-10-27 NOTE — ED Notes (Signed)
Breakfast ordered 

## 2019-10-27 NOTE — Care Management (Signed)
ED CM was called concerning late discharge. CM met with patient at bedside to discuss recommendation for [place] , PT.   Patient is agreeable and does not have a preference, CMS list quality discussed for preferred [facility] agencies.  CM contacted [facility] spoke with Amy Liaison referral accepted. CM attempted to contact patient's daughter Maryln Gottron [phone] LVM concerning patient's discharge and transitional care plan, still awaiting a return call, as patient does not have keys to her home with her. Updated Doroteo Bradford RN on 3E.

## 2019-10-27 NOTE — TOC Progression Note (Signed)
Transition of Care [facility]) - Progression Note    Patient Details  Name: Morgan Farmer MRN: [id number] Date of Birth: [birth year]  Transition of [facility]) CM/SW Contact  Leone Haven, RN Phone Number: 10/27/2019, 4:36 PM  Clinical Narrative:     NCM spoke with patient, she lives alone, she states she does not need any DME , she walk just as good as I can, she states she does not need medication ast , she ususally has no copay amts.  She states her daughter will be transporting her home at discharge.        Expected Discharge Plan and Services                                                 Social Determinants of Health (SDOH) Interventions    Readmission Risk Interventions No flowsheet data found.

## 2019-10-27 NOTE — ED Notes (Signed)
Med given 

## 2019-10-27 NOTE — Progress Notes (Signed)
  Echocardiogram 2D Echocardiogram has been performed.  Morgan Farmer 10/27/2019, 9:52 AM

## 2019-10-27 NOTE — Progress Notes (Signed)
Left 2 messages with dtr Lubertha Sayres regarding discharge, asking Morgan Farmer to call me back.

## 2019-10-27 NOTE — Progress Notes (Signed)
Subjective:   Pt was seen at the bedside today. Pt states that she is feeling well today. She denies any headache, chest pain, SOB or changes in her dietary habits. She notes that she was feeling her heart fluttering yesterday and generalized weakness for which she called EMS but has since felt fine.  She states that she is planning to move to [facility] to live with her younger cousin. She states that she will look for a doctor when she gets there. She says she may move there in July. All concerns were addressed.  Objective: CBC Latest Ref Rng & Units 10/27/2019 10/26/2019  WBC 4.0 - 10.5 K/uL 4.7 6.3  Hemoglobin 12.0 - 15.0 g/dL 10.7(L) 11.4(L)  Hematocrit 36.0 - 46.0 % 34.4(L) 36.2  Platelets 150 - 400 K/uL 171 165   BMP Latest Ref Rng & Units 10/27/2019 10/26/2019  Glucose 70 - 99 mg/dL 87 120(H)  BUN 8 - 23 mg/dL 35(H) 37(H)  Creatinine 0.44 - 1.00 mg/dL 1.44(H) 1.45(H)  Sodium 135 - 145 mmol/L 140 142  Potassium 3.5 - 5.1 mmol/L 3.8 3.5  Chloride 98 - 111 mmol/L 104 105  CO2 22 - 32 mmol/L 23 27  Calcium 8.9 - 10.3 mg/dL 9.0 9.5   Vital signs in last 24 hours: Vitals:   10/27/19 0630 10/27/19 0700 10/27/19 0730 10/27/19 0800  BP: (!) 144/53 (!) 173/56 (!) 175/54 (!) 172/53  Pulse: 60 61 (!) 59 63  Resp: 13 12 14 17   Temp:      TempSrc:      SpO2: 96% 95% (!) 88% 96%  Weight:      Height:       Physical Exam General: frail appearing elderly female, no acute distress  HENT: [place]/AT, EOMI, conjunctivae nl, Cardiovascular: RRR, S1 and S2 present, harsh systolic murmur 3/6 radiating to carotids, also noted to have soft diastolic murmur, no rubs or gallops appreciated  Respiratory: clear to auscultation bilaterally, no rhonchi or wheezing noted GI/Abdomen: nondistended, soft, normoactive bowel sounds, nontender to palpation  Neurologic: awake, alert and oriented x3, no obvious focal neurologic deficits  Assessment/Plan:  Active Problems:   Hypertensive urgency  In summary,  Ms. Shanley is a 85 y.o F with a PMHx of  aortic aneurysm s/p repair, aortic insufficiency, malignant hypertension and CKD Stage III here with symptomatic hypertension.  Hypertensive Urgency Patient is on multiple antihypertensives at home; however per recent PCP notes, appears that she has been out of her hydralazine for several weeks. She was continued on her home medications with improvement in her SBP. Patient currently asymptomatic. Attempted to reach patient's daughter without much success. She would likely benefit from assistance with medication management at home.  -Amlodipine 10 mg daily -Clonidine 0.1mg  BID -Hydralazine 50 mg TID -Irbesartan 300 mg dialy  - TOC consult for medication management   HLD -Atorvastatin 40 mg daily  Harsh systolic murmur Patient has history of aortic insufficiency and aortic aneurysm s/p repair. She notes that she felt heart flutter prior to presentation. On examination, noted to have 3/6 harsh systolic murmur radiating to carotids with soft diastolic murmur. Echo obtained that showed round hyperechoic density between right and noncoronary cusps and the cusps are partially fused but unable to r/o endocarditis. Patient does not currently express any signs of endocarditis. However, given this new finding, will consult cardiology prior to discharge for if patient needs further evaluation for this during admission vs outpatient follow up  - F/u cardiology recommendations - Continue cardiac  monitoring   CKD Stage III Patient with history of CKDIII noted to have sCr of 1.45 with GFR of 39 (sCr 1.35 with GFR 42 in January).  - Will need f/u BMP  - Avoid nephrotoxic agents   FEN/GI -Diet: heart healthy -Fluids: none  DVT prophylaxis - Heparin 5000 units q8hrs  CODE STATUS: FULL  #Dispo: Anticipated discharge in approximately 0-1 day(s).  Prior to Admission Living Arrangement: Home Anticipated Discharge Location: Home  Barriers to Discharge: Continued  medical management   Eliezer Bottom, MD Internal Medicine, PGY-1 Pager # [phone] 10/27/19 3:45 PM

## 2019-10-28 DIAGNOSIS — R002 Palpitations: Secondary | ICD-10-CM | POA: Diagnosis not present

## 2019-10-28 DIAGNOSIS — N183 Chronic kidney disease, stage 3 unspecified: Secondary | ICD-10-CM | POA: Diagnosis not present

## 2019-10-28 DIAGNOSIS — I160 Hypertensive urgency: Secondary | ICD-10-CM | POA: Diagnosis not present

## 2019-10-28 DIAGNOSIS — R011 Cardiac murmur, unspecified: Secondary | ICD-10-CM | POA: Diagnosis not present

## 2019-10-28 DIAGNOSIS — E785 Hyperlipidemia, unspecified: Secondary | ICD-10-CM | POA: Diagnosis not present

## 2019-10-28 LAB — BASIC METABOLIC PANEL
Anion gap: 8 (ref 5–15)
Anion gap: 13 (ref 5–15)
BUN: 44 mg/dL — ABNORMAL HIGH (ref 8–23)
BUN: 41 mg/dL — ABNORMAL HIGH (ref 8–23)
CO2: 26 mmol/L (ref 22–32)
CO2: 25 mmol/L (ref 22–32)
Calcium: 8.7 mg/dL — ABNORMAL LOW (ref 8.9–10.3)
Calcium: 9.2 mg/dL (ref 8.9–10.3)
Chloride: 103 mmol/L (ref 98–111)
Chloride: 100 mmol/L (ref 98–111)
Creatinine, Ser: 2.05 mg/dL — ABNORMAL HIGH (ref 0.44–1.00)
Creatinine, Ser: 1.91 mg/dL — ABNORMAL HIGH (ref 0.44–1.00)
GFR calc Af Amer: 26 mL/min — ABNORMAL LOW (ref >60)
GFR calc Af Amer: 28 mL/min — ABNORMAL LOW (ref >60)
GFR calc non Af Amer: 22 mL/min — ABNORMAL LOW (ref >60)
GFR calc non Af Amer: 24 mL/min — ABNORMAL LOW (ref >60)
Glucose, Bld: 105 mg/dL — ABNORMAL HIGH (ref 70–99)
Glucose, Bld: 73 mg/dL (ref 70–99)
Potassium: 4 mmol/L (ref 3.5–5.1)
Potassium: 3.7 mmol/L (ref 3.5–5.1)
Sodium: 137 mmol/L (ref 135–145)
Sodium: 138 mmol/L (ref 135–145)

## 2019-10-28 LAB — GLUCOSE, CAPILLARY
Glucose-Capillary: 101 mg/dL — ABNORMAL HIGH (ref 70–99)
Glucose-Capillary: 100 mg/dL — ABNORMAL HIGH (ref 70–99)

## 2019-10-28 LAB — CBC
HCT: 31.3 % — ABNORMAL LOW (ref 36.0–46.0)
Hemoglobin: 10.1 g/dL — ABNORMAL LOW (ref 12.0–15.0)
MCH: 23.7 pg — ABNORMAL LOW (ref 26.0–34.0)
MCHC: 32.3 g/dL (ref 30.0–36.0)
MCV: 73.3 fL — ABNORMAL LOW (ref 80.0–100.0)
Platelets: 199 10*3/uL (ref 150–400)
RBC: 4.27 MIL/uL (ref 3.87–5.11)
RDW: 16.3 % — ABNORMAL HIGH (ref 11.5–15.5)
WBC: 4.9 10*3/uL (ref 4.0–10.5)
nRBC: 0 % (ref 0.0–0.2)

## 2019-10-28 LAB — SODIUM, URINE, RANDOM: Sodium, Ur: 84 mmol/L

## 2019-10-28 LAB — CREATININE, URINE, RANDOM: Creatinine, Urine: 70.99 mg/dL

## 2019-10-28 MED ORDER — LACTATED RINGERS IV BOLUS
1000.0000 mL | Freq: Once | INTRAVENOUS | Status: AC
  Administered 2019-10-28: 1000 mL via INTRAVENOUS

## 2019-10-28 NOTE — TOC Progression Note (Signed)
Transition of Care [facility]) - Progression Note    Patient Details  Name: Morgan Farmer MRN: [id number] Date of Birth: 1934-10-11  Transition of Care [facility]) CM/SW Contact  Leone Haven, RN Phone Number: 10/28/2019, 10:08 AM  Clinical Narrative:    NCM spoke with patient she chose [facility], referral made to [facility] with [facility] for HHPT, HHRN,.  She is able to take referral.  Soc will begin 24 to 48 hrs post dc.  She will be going to daughters address in [place].    Expected Discharge Plan: Home w Home Health Services Barriers to Discharge: No Barriers Identified  Expected Discharge Plan and Services Expected Discharge Plan: Home w Home Health Services   Discharge Planning Services: CM Consult Post Acute Care Choice: Home Health Living arrangements for the past 2 months: Single Family Home Expected Discharge Date: 10/27/19                         HH Arranged: RN, Disease Management, PT HH Agency: [facility] Date HH Agency Contacted: 10/28/19 Time HH Agency Contacted: 1007 Representative spoke with at [facility] Agency: Cassie   Social Determinants of Health (SDOH) Interventions    Readmission Risk Interventions No flowsheet data found.

## 2019-10-28 NOTE — Evaluation (Signed)
Occupational Therapy Evaluation Patient Details Name: Morgan Farmer MRN: [id number] DOB: 1934/08/27 Today's Date: 10/28/2019    History of Present Illness 85 yo F w/ a PMHx of aortic dissection s/p repair, aortic insufficiency, malignant hypertension and CKD Stage III here with symptomatic hypertension. Admitted for observation and treatment of hypertensive urgency 10/26/19    Clinical Impression   Pt admitted with above diagnoses, presents with overall generalized weakness and poor activity tolerance. Pt completed bed mobility with supervision assist and sit <> stands with min guard/min A. With initial stand, pt immediately sitting back onto bed due to fatigue and weakness. Encouraged RW usage, pt refusing. Extensive education given on home safety for pt to return home with daughter and not be home alone in a two story home. Pt was able to complete standing grooming task for ~5 minutes before needing seated rest break. Recommend HHOT to work on activity tolerance for increased BADL/IADL independence in the home environment. Will continue to follow.    Follow Up Recommendations  Home health OT;Supervision - Intermittent    Equipment Recommendations  None recommended by OT    Recommendations for Other Services       Precautions / Restrictions Precautions Precautions: Fall Restrictions Weight Bearing Restrictions: No      Mobility Bed Mobility Overal bed mobility: Needs Assistance Bed Mobility: Supine to Sit     Supine to sit: Supervision        Transfers Overall transfer level: Needs assistance Equipment used: 1 person hand held assist;2 person hand held assist Transfers: Sit to/from Stand Sit to Stand: Min guard;Min assist         General transfer comment: with initial stand from bed, pt immediatley sitting back down due to LOB. 2 person HHA assist then completed with mim guard    Balance Overall balance assessment: Needs assistance Sitting-balance support: No  upper extremity supported;Feet supported Sitting balance-Leahy Scale: Good     Standing balance support: Bilateral upper extremity supported;During functional activity Standing balance-Leahy Scale: Fair Standing balance comment: poor dynamic standing balance                           ADL either performed or assessed with clinical judgement   ADL Overall ADL's : Needs assistance/impaired Eating/Feeding: Set up;Sitting   Grooming: Supervision/safety;Standing Grooming Details (indicate cue type and reason): to complete ~5 minutes standing grooming task, pt then fatigued and self cueing for rest break Upper Body Bathing: Set up;Sitting   Lower Body Bathing: Set up;Sitting/lateral leans;Sit to/from stand   Upper Body Dressing : Set up;Sitting   Lower Body Dressing: Set up;Sit to/from stand Lower Body Dressing Details (indicate cue type and reason): figure 4 position EOB Toilet Transfer: Min guard;Ambulation;Regular Toilet;Grab bars   Toileting- Clothing Manipulation and Hygiene: Set up;Sitting/lateral lean;Sit to/from stand   Tub/ Shower Transfer: Min guard;Ambulation;Shower seat   Functional mobility during ADLs: Min guard;Cueing for safety General ADL Comments: pt refusing to use RW despite dynamic standing balance challenges     Vision Patient Visual Report: No change from baseline       Perception     Praxis      Pertinent Vitals/Pain Pain Assessment: No/denies pain     Hand Dominance Right   Extremity/Trunk Assessment Upper Extremity Assessment Upper Extremity Assessment: Generalized weakness   Lower Extremity Assessment Lower Extremity Assessment: Generalized weakness       Communication Communication Communication: No difficulties   Cognition Arousal/Alertness: Awake/alert Behavior During Therapy:  WFL for tasks assessed/performed Overall Cognitive Status: Impaired/Different from baseline Area of Impairment: Safety/judgement;Memory                      Memory: Decreased short-term memory   Safety/Judgement: Decreased awareness of deficits;Decreased awareness of safety     General Comments: overall poor awareness of situation, very repetitive throughout session.   General Comments       Exercises     Shoulder Instructions      Home Living Family/patient expects to be discharged to:: Private residence Living Arrangements: Alone Available Help at Discharge: Family;Available 24 hours/day Type of Home: House Home Access: Stairs to enter [employer] of Steps: 4 Entrance Stairs-Rails: Right;Left Home Layout: Bed/bath upstairs     Bathroom Shower/Tub: Teacher, early years/pre: Standard Bathroom Accessibility: (not sure)       Additional Comments: information reflects daughters home. Pt normally lives alone in two story home      Prior Functioning/Environment Level of Independence: Independent        Comments: ambulates without AD, independent with ADL, iADLs. daughter drives to grocery store         OT Problem List: Decreased strength;Decreased knowledge of use of DME or AE;Decreased activity tolerance;Decreased cognition;Cardiopulmonary status limiting activity;Impaired balance (sitting and/or standing);Decreased safety awareness      OT Treatment/Interventions: Self-care/ADL training;Therapeutic exercise;Patient/family education;Balance training;Therapeutic activities;Energy conservation;DME and/or AE instruction    OT Goals(Current goals can be found in the care plan section) Acute Rehab OT Goals Patient Stated Goal: return to my home OT Goal Formulation: With patient Time For Goal Achievement: 11/11/19 Potential to Achieve Goals: Good  OT Frequency: Min 2X/week   Barriers to D/C:            Co-evaluation              AM-PAC OT "6 Clicks" Daily Activity     Outcome Measure Help from another person eating meals?: None Help from another person taking care of  personal grooming?: A Little Help from another person toileting, which includes using toliet, bedpan, or urinal?: A Little Help from another person bathing (including washing, rinsing, drying)?: A Little Help from another person to put on and taking off regular upper body clothing?: None Help from another person to put on and taking off regular lower body clothing?: None 6 Click Score: 21   End of Session Equipment Utilized During Treatment: Gait belt Nurse Communication: Mobility status  Activity Tolerance: Patient tolerated treatment well Patient left: in chair;with call bell/phone within reach  OT Visit Diagnosis: Unsteadiness on feet (R26.81);Other abnormalities of gait and mobility (R26.89);Muscle weakness (generalized) (M62.81)                Time: 4315-4008 OT Time Calculation (min): 18 min Charges:  OT General Charges $OT Visit: 1 Visit OT Evaluation $OT Eval Moderate Complexity: [place], MSOT, OTR/L Acute Rehabilitation Services [facility] Office Number: [phone] Pager: [phone]  Zenovia Jarred 10/28/2019, 11:00 AM

## 2019-10-28 NOTE — Progress Notes (Signed)
Spoke with daughter let her know MD stated patient needed labwork after fluid bolus and based on those results would determine if patient going home today or not. Told her probably this evening before we could all that accomplished my best timeframe for her at present. Daughter reported just give her a call let her know.

## 2019-10-28 NOTE — Progress Notes (Signed)
Daughter called back reported her husband on way pick up her mom informed her of pick up area to call when he arrives. PIV removed intact dry dressing applied. Discharge instructions given to patient. NO further changes noted.

## 2019-10-28 NOTE — Plan of Care (Signed)
  Problem: Education: Goal: Knowledge of General Education information will improve Description: Including pain rating scale, medication(s)/side effects and non-pharmacologic comfort measures 10/28/2019 1459 by Saunders Revel, RN Outcome: Adequate for Discharge 10/28/2019 0941 by Saunders Revel, RN Outcome: Progressing   Problem: Health Behavior/Discharge Planning: Goal: Ability to manage health-related needs will improve 10/28/2019 1459 by Saunders Revel, RN Outcome: Adequate for Discharge 10/28/2019 0941 by Saunders Revel, RN Outcome: Progressing   Problem: Clinical Measurements: Goal: Ability to maintain clinical measurements within normal limits will improve 10/28/2019 1459 by Saunders Revel, RN Outcome: Adequate for Discharge 10/28/2019 0941 by Saunders Revel, RN Outcome: Progressing Goal: Will remain free from infection 10/28/2019 1459 by Saunders Revel, RN Outcome: Adequate for Discharge 10/28/2019 0941 by Saunders Revel, RN Outcome: Progressing Goal: Diagnostic test results will improve 10/28/2019 1459 by Saunders Revel, RN Outcome: Adequate for Discharge 10/28/2019 0941 by Saunders Revel, RN Outcome: Progressing Goal: Respiratory complications will improve 10/28/2019 1459 by Saunders Revel, RN Outcome: Adequate for Discharge 10/28/2019 0941 by Saunders Revel, RN Outcome: Progressing Goal: Cardiovascular complication will be avoided 10/28/2019 1459 by Saunders Revel, RN Outcome: Adequate for Discharge 10/28/2019 0941 by Saunders Revel, RN Outcome: Progressing   Problem: Activity: Goal: Risk for activity intolerance will decrease 10/28/2019 1459 by Saunders Revel, RN Outcome: Adequate for Discharge 10/28/2019 0941 by Saunders Revel, RN Outcome: Progressing   Problem: Nutrition: Goal: Adequate nutrition will be maintained 10/28/2019 1459 by Saunders Revel, RN Outcome: Adequate for Discharge 10/28/2019 0941 by  Saunders Revel, RN Outcome: Progressing   Problem: Coping: Goal: Level of anxiety will decrease 10/28/2019 1459 by Saunders Revel, RN Outcome: Adequate for Discharge 10/28/2019 0941 by Saunders Revel, RN Outcome: Progressing   Problem: Elimination: Goal: Will not experience complications related to bowel motility 10/28/2019 1459 by Saunders Revel, RN Outcome: Adequate for Discharge 10/28/2019 0941 by Saunders Revel, RN Outcome: Progressing Goal: Will not experience complications related to urinary retention 10/28/2019 1459 by Saunders Revel, RN Outcome: Adequate for Discharge 10/28/2019 0941 by Saunders Revel, RN Outcome: Progressing   Problem: Pain Managment: Goal: General experience of comfort will improve 10/28/2019 1459 by Saunders Revel, RN Outcome: Adequate for Discharge 10/28/2019 0941 by Saunders Revel, RN Outcome: Progressing   Problem: Safety: Goal: Ability to remain free from injury will improve 10/28/2019 1459 by Saunders Revel, RN Outcome: Adequate for Discharge 10/28/2019 0941 by Saunders Revel, RN Outcome: Progressing   Problem: Skin Integrity: Goal: Risk for impaired skin integrity will decrease 10/28/2019 1459 by Saunders Revel, RN Outcome: Adequate for Discharge 10/28/2019 0941 by Saunders Revel, RN Outcome: Progressing

## 2019-10-28 NOTE — Plan of Care (Signed)

## 2019-10-28 NOTE — Discharge Summary (Signed)
Name: Morgan Farmer MRN: [id number] DOB: 10-27-1934 85 y.o. PCP: Rudene Anda, MD  Date of Admission: 10/26/2019 10:54 AM Date of Discharge: 10/28/2019 Attending Physician: Dr. Lenice Pressman  Discharge Diagnosis: 1. Hypertensive urgency 2. Aortic insufficiency w/Hx of aortic aneurysm s/p repair 3. ATN in setting of CKDIII  Discharge Medications: Allergies as of 10/28/2019   No Known Allergies     Medication List    TAKE these medications   amLODipine 10 MG tablet Commonly known as: NORVASC Take 1 tablet (10 mg total) by mouth daily. Notes to patient: Tomorrow, 10/29/19.   ascorbic acid 500 MG tablet Commonly known as: VITAMIN C Take 500-1,000 mg by mouth daily. Notes to patient: Tomorrow, 10/29/19   atorvastatin 20 MG tablet Commonly known as: Lipitor Take 1 tablet (20 mg total) by mouth daily. Notes to patient: Tomorrow, 10/29/2019.   cloNIDine 0.1 MG tablet Commonly known as: CATAPRES Take 0.1 mg by mouth 2 (two) times daily. Notes to patient: Tonight, 10/28/19.   furosemide 40 MG tablet Commonly known as: LASIX Take 1 tablet (40 mg total) by mouth daily. Notes to patient: Tomorrow, 10/28/19.   GARLIC PO Take 1 tablet by mouth daily. Notes to patient: Tomorrow, 10/29/2019   hydrALAZINE 50 MG tablet Commonly known as: APRESOLINE Take 50 mg by mouth 3 (three) times daily. Notes to patient: Tonight, 10/28/19.   hydrochlorothiazide 12.5 MG tablet Commonly known as: HYDRODIURIL Take 2 tablets (25 mg total) by mouth daily. Notes to patient: Tomorrow, 10/28/19.   valsartan 320 MG tablet Commonly known as: DIOVAN Take 320 mg by mouth daily. Notes to patient: Tomorrow, 10/29/2019.   Vitamin D-3 25 MCG (1000 UT) Caps Take 1,000 Units by mouth daily. Notes to patient: Tomorrow, 10/29/2019       Disposition and follow-up:   Ms.Morgan Farmer was discharged from [facility] in Stable condition.  At the hospital follow up visit please  address:  1.  Hypertensive urgency: Recommend continuing all home medication as prescribed. [place] RN services ordered to assist with medication management   Aortic insufficiency: Stable, recommend f/u with cardiology as outpatient   ATN in setting of CKDIII: sCr 1.9 on discharge; Will need f/u BMP in 1 week  2.  Labs / imaging needed at time of follow-up: BMP  3.  Pending labs/ test needing follow-up: None  Follow-up Appointments: Follow-up Information    Rudene Anda, MD. Go on 11/03/2019.   Specialty: Internal Medicine Why: @9 :00am Contact information: [address] [place] [address] [phone]        [facility] Follow up.   Specialty: Home Health Services Why: [place] RN and Physical Therapy Contact information: [place] [address] [place] Hospital Course by problem list: 1. Hypertensive urgency: Patient presented with hypertensive urgency with mild troponin elevation that down trended without ischemic changes noted on EKG or other chest pain and shortness of breath. She did have mild headache on admission. Patient initially started on cleviprex drip in the ED which was discontinued and patient restarted on home medications. She did receive extra hydralazine and clonidine dose intially. SBP corrected to 130-150 within 24 hours. Patient resumed on home medications. She remains asymptomatic. She is stable for discharge to home but would benefit from medication management assistance at home for which [facility] RN order placed.   2. Aortic insufficiency Hx of aortic aneurysm s/p repair Patient has history  of aortic insufficiency and aortic aneurysm s/p repair. Noted to have 3/6 harsh systolic murmur radiating to carotids with soft diastolic murmur. Echo obtained that showed round hyperechoic density between right and noncoronary cusps and the cusps are partially fused. Discussed with cardiology during  admission. Changes are likely chronic and related to prior surgery for her aortic aneurysm and her aortic insufficiency identical to Echo from September 2020.  - Recommend follow up with cardiologist as outpatient   3. ATN in setting of CKDIII Patient with history of CKDIII noted to have sCr of 1.45 on admission (baseline 1.35). Following correction of her hypertension, patient noted to have increased sCr to 2.05; however continued to have good urine output. Urine studies obtained, consistent with acute tubular necrosis. Patient given 1L bolus and on recheck, sCr 1.91. Patient to be discharged home with close follow up in 1 week. Recommend f/u BMP.   Discharge Vitals:   BP (!) 137/42 (BP Location: Right Arm)   Pulse 62   Temp 97.6 F (36.4 C) (Oral)   Resp 20   Ht 5\' 3"  (1.6 m)   Wt 56.7 kg   SpO2 100%   BMI 22.14 kg/m   Pertinent Labs, Studies, and Procedures:  CBC Latest Ref Rng & Units 10/28/2019 10/27/2019 10/26/2019  WBC 4.0 - 10.5 K/uL 4.9 4.7 6.3  Hemoglobin 12.0 - 15.0 g/dL 10.1(L) 10.7(L) 11.4(L)  Hematocrit 36.0 - 46.0 % 31.3(L) 34.4(L) 36.2  Platelets 150 - 400 K/uL 199 171 165   CMP Latest Ref Rng & Units 10/28/2019 10/28/2019 10/27/2019  Glucose 70 - 99 mg/dL 73 10/29/2019) 87  BUN 8 - 23 mg/dL 962(I) 29(N) 98(X)  Creatinine 0.44 - 1.00 mg/dL 21(J) 9.41(D) 4.08(X)  Sodium 135 - 145 mmol/L 138 137 140  Potassium 3.5 - 5.1 mmol/L 3.7 4.0 3.8  Chloride 98 - 111 mmol/L 100 103 104  CO2 22 - 32 mmol/L 25 26 23   Calcium 8.9 - 10.3 mg/dL 9.2 4.48(J) 9.0  Total Protein 6.5 - 8.1 g/dL - - -  Total Bilirubin 0.3 - 1.2 mg/dL - - -  Alkaline Phos 38 - 126 U/L - - -  AST 15 - 41 U/L - - -  ALT 0 - 44 U/L - - -   Urinalysis    Component Value Date/Time   COLORURINE STRAW (A) 10/26/2019 1315   APPEARANCEUR CLEAR 10/26/2019 1315   LABSPEC 1.004 (L) 10/26/2019 1315   PHURINE 5.0 10/26/2019 1315   GLUCOSEU NEGATIVE 10/26/2019 1315   HGBUR NEGATIVE 10/26/2019 1315   BILIRUBINUR  NEGATIVE 10/26/2019 1315   KETONESUR 5 (A) 10/26/2019 1315   PROTEINUR NEGATIVE 10/26/2019 1315   NITRITE NEGATIVE 10/26/2019 1315   LEUKOCYTESUR TRACE (A) 10/26/2019 1315   Troponin I (High Sensitivity) <18 ng/L 64High   52High  CM    Lipase 11 - 51 U/L 21    B Natriuretic Peptide 0.0 - 100.0 pg/mL 359.8High     Sodium, Ur mmol/L 84    Creatinine, Urine mg/dL 10/28/2019     CXR 10/28/2019: IMPRESSION: 1. No active cardiopulmonary disease. 2. Stable cardiomegaly.  CT HEAD WO CONTRAST 10/26/2019: IMPRESSION: 1. No acute intracranial process. 2. Atrophy and chronic microvascular ischemic changes. 3. There is a 2.3 cm partially calcified meningioma within the left aspect of the posterior fossa.   Discharge Instructions: Discharge Instructions    Call MD for:  difficulty breathing, headache or visual disturbances   Complete by: As directed    Call MD for:  extreme  fatigue   Complete by: As directed    Call MD for:  hives   Complete by: As directed    Call MD for:  persistant dizziness or light-headedness   Complete by: As directed    Call MD for:  persistant nausea and vomiting   Complete by: As directed    Call MD for:  severe uncontrolled pain   Complete by: As directed    Call MD for:  temperature >100.4   Complete by: As directed    Diet - low sodium heart healthy   Complete by: As directed    Diet - low sodium heart healthy   Complete by: As directed    Discharge instructions   Complete by: As directed    Ms. Matty,  You were admitted to the hospital with severely high blood pressure. This improved with medication and resuming your home medications. We also obtained an Echo (ultrasound of your heart) during this admission which did not show any changes from your previous exams. Please continue to take all your medications as prescribed. Please follow up with your primary care physician within 1 week for your blood pressure.   Thank you!   Discharge instructions    Complete by: As directed    Ms. Cappiello,  We monitored your kidneys in the hospital and gave you fluids for this. Please follow up with your primary care doctor within 1 week to check on your kidney levels.  Thank you!   Increase activity slowly   Complete by: As directed    Increase activity slowly   Complete by: As directed       Signed: Eliezer Bottom, MD 10/28/2019, 4:53 PM   Pager: [phone]

## 2019-10-28 NOTE — Progress Notes (Signed)
Subjective: HD 2 Overnight, patient unable to get transportation home for discharge.  This morning, patient evaluated at bedside. Pt states that she feels weak because she has not been eating well. We explained how her kidney function decreased from yesterday, and how we would like to give her some fluids. Pt is in agreement with the plan.   Objective: Vital signs in last 24 hours: Vitals:   10/27/19 1606 10/27/19 2014 10/28/19 0029 10/28/19 0557  BP: (!) 138/49 (!) 155/48 (!) 132/54 (!) 157/48  Pulse: (!) 58 66 71 (!) 50  Resp: 19 17 17 17   Temp: 98.3 F (36.8 C) (!) 97.4 F (36.3 C) (!) 97.4 F (36.3 C) 97.9 F (36.6 C)  TempSrc: Oral Oral Oral Oral  SpO2: 97% 98% 99% 97%  Weight:   56.7 kg   Height:       CBC Latest Ref Rng & Units 10/28/2019 10/27/2019 10/26/2019  WBC 4.0 - 10.5 K/uL 4.9 4.7 6.3  Hemoglobin 12.0 - 15.0 g/dL 10.1(L) 10.7(L) 11.4(L)  Hematocrit 36.0 - 46.0 % 31.3(L) 34.4(L) 36.2  Platelets 150 - 400 K/uL 199 171 165   CMP Latest Ref Rng & Units 10/28/2019 10/27/2019 10/26/2019  Glucose 70 - 99 mg/dL 10/28/2019) 87 413(K)  BUN 8 - 23 mg/dL 440(N) 02(V) 25(D)  Creatinine 0.44 - 1.00 mg/dL 66(Y) 4.03(K) 7.42(V)  Sodium 135 - 145 mmol/L 137 140 142  Potassium 3.5 - 5.1 mmol/L 4.0 3.8 3.5  Chloride 98 - 111 mmol/L 103 104 105  CO2 22 - 32 mmol/L 26 23 27   Calcium 8.9 - 10.3 mg/dL 9.56(L) 9.0 9.5  Total Protein 6.5 - 8.1 g/dL - - 7.9  Total Bilirubin 0.3 - 1.2 mg/dL - - 1.2  Alkaline Phos 38 - 126 U/L - - 63  AST 15 - 41 U/L - - 20  ALT 0 - 44 U/L - - 14    Physical Exam General: frail appearing elderly female, no acute distress  HENT: [place]/AT, EOMI, conjunctivae nl, Cardiovascular: RRR, S1 and S2 present, harsh systolic murmur 3/6 radiating to carotids, also noted to have soft diastolic murmur, no rubs or gallops appreciated  Respiratory: clear to auscultation bilaterally, no rhonchi or wheezing noted GI/Abdomen: nondistended, soft, normoactive bowel sounds,  nontender to palpation  Neurologic: awake, alert and oriented x3, no obvious focal neurologic deficits  Assessment/Plan: [name] is a 85 y.o F with a PMHx of  aortic aneurysm s/p repair, aortic insufficiency, malignant hypertension and CKD Stage III here with symptomatic hypertension.  Hypertensive Urgency Patient is on multiple antihypertensives at home; however per recent PCP notes, appears that she has been out of her hydralazine for several weeks. She was continued on her home medications with improvement in her SBP. Patient's SBP 130-150 overnight. Currently asymptomatic. Patient would benefit from medication management assistance at home.  -Amlodipine 10 mg daily -Clonidine 0.1mg  BID -Hydralazine 50 mg TID -Irbesartan 300 mg dialy  - HH RN at home for medication management assistance   Aortic insufficiency Patient has history of aortic insufficiency and aortic aneurysm s/p repair. Noted to have 3/6 harsh systolic murmur radiating to carotids with soft diastolic murmur. Echo obtained that showed round hyperechoic density between right and noncoronary cusps and the cusps are partially fused. Discussed with cardiology during admission. Changes are likely chronic and related to prior surgery for her aortic aneurysm and her aortic insufficiency identical to Echo from September 2020.  - Recommend follow up with cardiologist as outpatient  Acute on chronic CKD Stage III Patient with history of CKDIII noted to have sCr of 1.45 on admission (baseline 1.35). This morning, sCr elevated to 2.05. Patient still having good urine output. Suspect this may be in setting of decreased perfusion to her kidney with possible ATN. Will give 1L fluid bolus.  -  f/u BMP  - Urine studies - Avoid nephrotoxic agents   FEN/GI -Diet: heart healthy -Fluids: none  DVT prophylaxis - Heparin 5000 units q8hrs  CODE STATUS: FULL  #Dispo: Anticipated discharge in approximately 0-1 day(s).  Prior to  Admission Living Arrangement: Home Anticipated Discharge Location: Home  Barriers to Discharge: Continued medical management   Harvie Heck, MD Internal Medicine, PGY-1 Pager # [phone] 10/28/19 7:09 AM

## 2019-10-28 NOTE — TOC Progression Note (Addendum)
Transition of Care [facility]) - Progression Note    Patient Details  Name: Morgan Farmer MRN: [id number] Date of Birth: 07-Jun-1935  [facility] CM/SW Contact  Leone Haven, RN Phone Number: 10/28/2019, 9:39 AM  Clinical Narrative:    Patient has changed her mind about getting home health.  NCM went to speak with patient she is working with physical therapy, will come back when they are done. Patient will be going to daughter 's house at [address] , [place] [address].        Expected Discharge Plan and Services           Expected Discharge Date: 10/27/19                           [facility] Agency: [facility] Date HH Agency Contacted: 10/27/19 Time HH Agency Contacted: [phone] Representative spoke with at [facility] Agency: Amy   Social Determinants of Health (SDOH) Interventions    Readmission Risk Interventions No flowsheet data found.

## 2019-10-28 NOTE — Evaluation (Signed)
Physical Therapy Evaluation Patient Details Name: Morgan Farmer MRN: [id number] DOB: 06-04-1935 Today's Date: 10/28/2019   History of Present Illness  85 yo F w/ a PMHx of aortic dissection s/p repair, aortic insufficiency, malignant hypertension and CKD Stage III here with symptomatic hypertension. Admitted for observation and treatment of hypertensive urgency 10/26/19   Clinical Impression  PTA pt reports living alone in multistory home with level entry. Pt reports independence with mobility, ADL and iADLs, but has not driven since moving from [place] last year. Pt is limited by decreased awareness of her deficits in presence of decreased strength and balance. Pt requires supervision for bed mobility, min A for transfers, ambulation and stairs. Pt refuses use of RW as "I don't need it." Pt reports that she can live with her daughter until she gets stronger. Pt will need supervision with all mobility given her decreased balance and HHPT. PT will continue to follow acutely     Follow Up Recommendations Home health PT;Supervision for mobility/OOB;Supervision/Assistance - 24 hour    Equipment Recommendations  None recommended by PT    Recommendations for Other Services       Precautions / Restrictions Precautions Precautions: Fall Restrictions Weight Bearing Restrictions: No      Mobility  Bed Mobility Overal bed mobility: Needs Assistance Bed Mobility: Supine to Sit     Supine to sit: Supervision        Transfers Overall transfer level: Needs assistance Equipment used: 1 person hand held assist;2 person hand held assist Transfers: Sit to/from Stand Sit to Stand: Min guard;Min assist         General transfer comment: with initial stand from bed, pt immediatley sitting back down due to LOB. 2 person HHA assist then completed with mim guard  Ambulation/Gait Ambulation/Gait assistance: Min assist Gait Distance (Feet): 120 Feet Assistive device: 1 person hand held  assist Gait Pattern/deviations: Step-through pattern;Shuffle;Decreased step length - right;Decreased step length - left;Narrow base of support Gait velocity: slowed Gait velocity interpretation: <1.31 ft/sec, indicative of household ambulator General Gait Details: pt refuses RW and requires minA for mildly unsteady gait, with narrow BoS, no overt LoB  Stairs Stairs: Yes Stairs assistance: Min assist Stair Management: One rail Left;Forwards;Alternating pattern Number of Stairs: 4 General stair comments: use of L handrail and min A for steadying with ascent/descent of 4 steps  Wheelchair Mobility    Modified Rankin (Stroke Patients Only)       Balance Overall balance assessment: Needs assistance Sitting-balance support: No upper extremity supported;Feet supported Sitting balance-Leahy Scale: Good     Standing balance support: Bilateral upper extremity supported;During functional activity Standing balance-Leahy Scale: Fair Standing balance comment: poor dynamic standing balance                             Pertinent Vitals/Pain Pain Assessment: No/denies pain    Home Living Family/patient expects to be discharged to:: Private residence Living Arrangements: Alone Available Help at Discharge: Family;Available 24 hours/day Type of Home: House Home Access: Stairs to enter Entrance Stairs-Rails: Psychiatric nurse of Steps: 4 Home Layout: Bed/bath upstairs   Additional Comments: information reflects daughters home. Pt normally lives alone in two story home    Prior Function Level of Independence: Independent         Comments: ambulates without AD, independent with ADL, iADLs. daughter drives to grocery store      [place]: Right    Extremity/Trunk  Assessment   Upper Extremity Assessment Upper Extremity Assessment: Generalized weakness    Lower Extremity Assessment Lower Extremity Assessment: Generalized  weakness       Communication   Communication: No difficulties  Cognition Arousal/Alertness: Awake/alert Behavior During Therapy: WFL for tasks assessed/performed Overall Cognitive Status: Impaired/Different from baseline Area of Impairment: Safety/judgement;Memory                     Memory: Decreased short-term memory   Safety/Judgement: Decreased awareness of deficits;Decreased awareness of safety     General Comments: overall poor awareness of situation, very repetitive throughout session.      General Comments General comments (skin integrity, edema, etc.): VSS on RA    Exercises     Assessment/Plan    PT Assessment Patient needs continued PT services  PT Problem List Decreased strength;Decreased activity tolerance;Decreased balance;Decreased mobility;Decreased safety awareness;Decreased knowledge of use of DME       PT Treatment Interventions DME instruction;Gait training;Stair training;Functional mobility training;Therapeutic activities;Therapeutic exercise;Balance training;Cognitive remediation    PT Goals (Current goals can be found in the Care Plan section)  Acute Rehab PT Goals Patient Stated Goal: return to my home PT Goal Formulation: With patient Time For Goal Achievement: 11/11/19 Potential to Achieve Goals: Good    Frequency Min 3X/week    AM-PAC PT "6 Clicks" Mobility  Outcome Measure Help needed turning from your back to your side while in a flat bed without using bedrails?: None Help needed moving from lying on your back to sitting on the side of a flat bed without using bedrails?: A Little Help needed moving to and from a bed to a chair (including a wheelchair)?: A Little Help needed standing up from a chair using your arms (e.g., wheelchair or bedside chair)?: A Little Help needed to walk in hospital room?: A Little Help needed climbing 3-5 steps with a railing? : A Little 6 Click Score: 19    End of Session Equipment Utilized During  Treatment: Gait belt Activity Tolerance: Patient tolerated treatment well;Patient limited by fatigue Patient left: in chair;with call bell/phone within reach Nurse Communication: Mobility status PT Visit Diagnosis: Unsteadiness on feet (R26.81);Other abnormalities of gait and mobility (R26.89);Muscle weakness (generalized) (M62.81);Difficulty in walking, not elsewhere classified (R26.2)    Time: 3545-6256 PT Time Calculation (min) (ACUTE ONLY): 24 min   Charges:   PT Evaluation $PT Eval Moderate Complexity: 1 Mod          Reena Borromeo B. Beverely Risen PT, DPT Acute Rehabilitation Services Pager [phone] Office [phone]   Elon Alas Fleet 10/28/2019, 11:47 AM

## 2019-10-28 NOTE — TOC Transition Note (Signed)
Transition of Care [facility]) - CM/SW Discharge Note   Patient Details  Name: Morgan Farmer MRN: [id number] Date of Birth: April 16, 1935  Transition of Care [facility]) CM/SW Contact:  Leone Haven, RN Phone Number: 10/28/2019, 12:16 PM   Clinical Narrative:       Final next level of care: Home w Home Health Services Barriers to Discharge: Continued Medical Work up   Patient Goals and CMS Choice Patient states their goals for this hospitalization and ongoing recovery are:: get better CMS Medicare.gov Compare Post Acute Care list provided to:: Patient Choice offered to / list presented to : Patient  Discharge Placement    NCM spoke with patient she chose [facility], referral made to [facility] with [facility] for HHPT, HHRN,.  She is able to take referral.  Soc will begin 24 to 48 hrs post dc.  She will be going to daughters address in [place].                    Discharge Plan and Services   Discharge Planning Services: CM Consult Post Acute Care Choice: Home Health                    HH Arranged: RN, Disease Management, PT HH Agency: [facility] Date [facility] Agency Contacted: 10/28/19 Time HH Agency Contacted: [phone] Representative spoke with at [facility] Agency: Cassie  Social Determinants of Health (SDOH) Interventions     Readmission Risk Interventions No flowsheet data found.

## 2021-01-04 IMAGING — DX DG CHEST 1V PORT
1 series · 1 of 1 positions shown · non-contrast
Comparison: CT a chest 10/21/2019

CLINICAL DATA: Shortness of breath, headache

EXAM:
PORTABLE CHEST 1 VIEW

[chest]
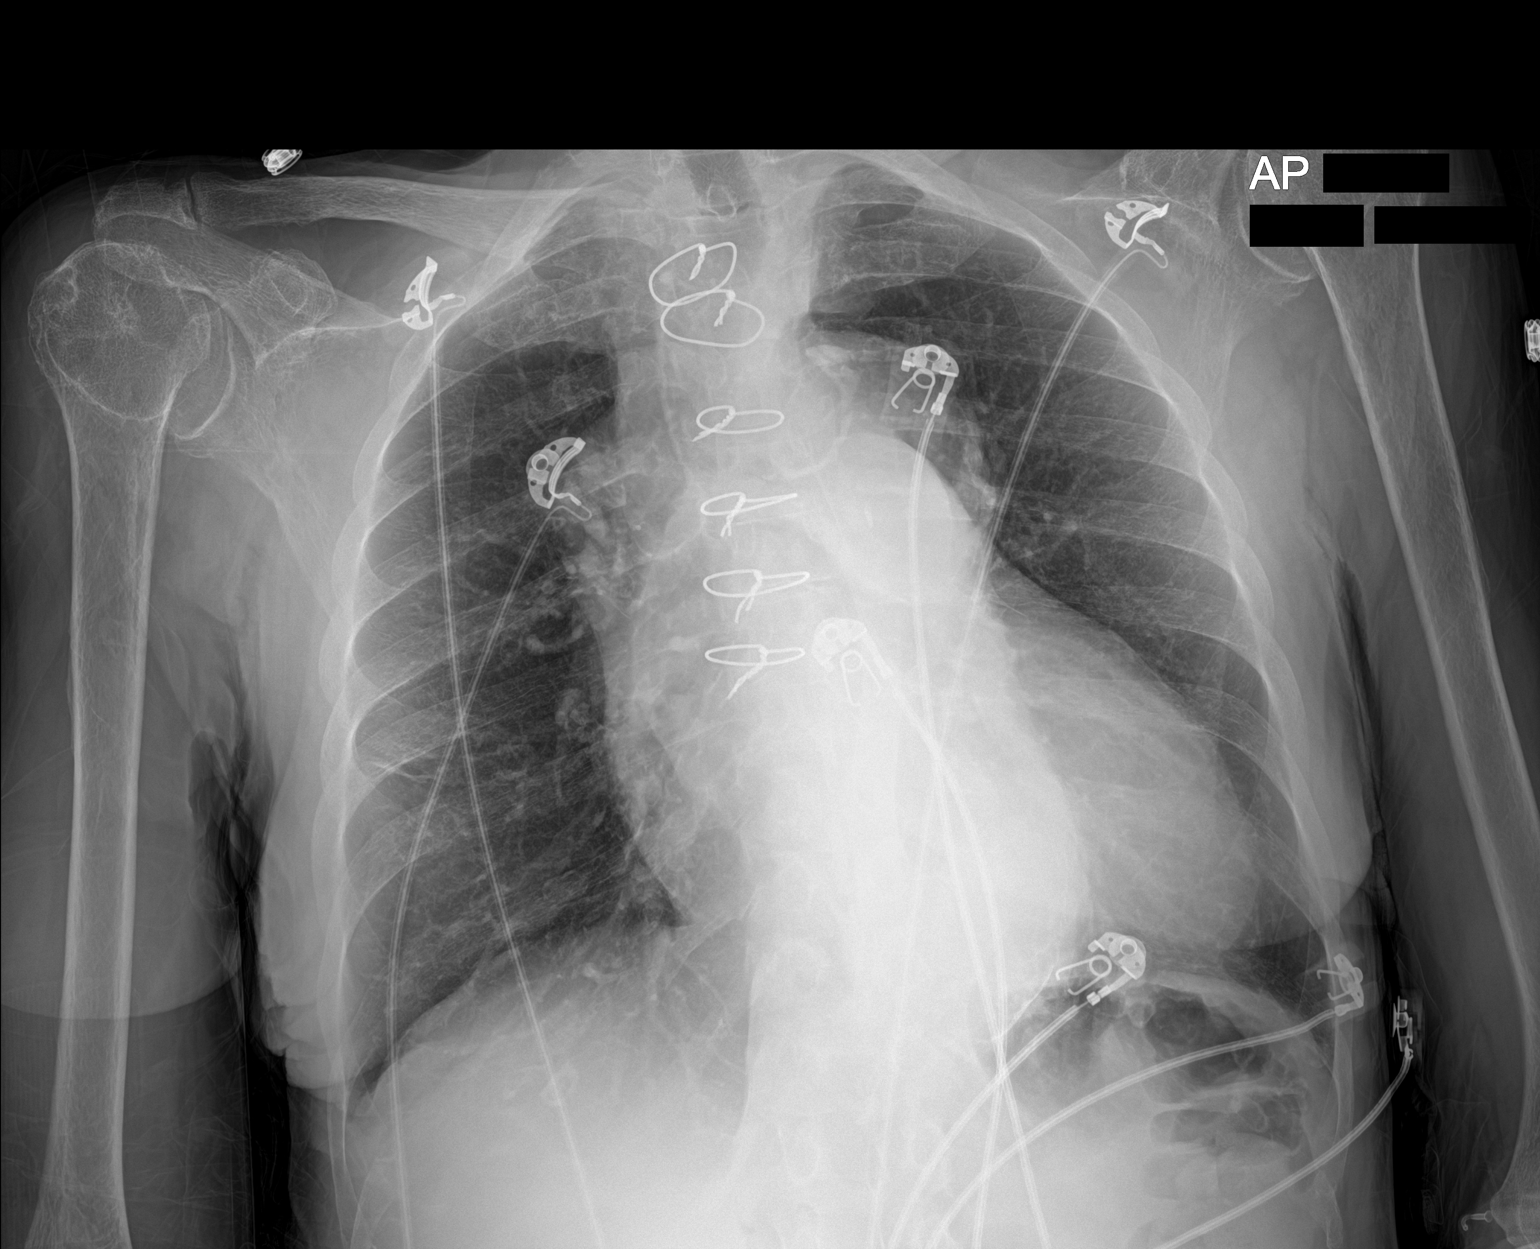

[1 of 1 positions shown; findings below may reference images not displayed]

FINDINGS: Heart size is markedly enlarged, accentuated by portable technique.
Dilation of the ascending thoracic aorta with tortuosity of
descending aorta similar to previous exam.

Osteopenia. Glenohumeral degenerative changes. No dense
consolidation, no sign of pleural effusion.
IMPRESSION: 1. No active cardiopulmonary disease.
2. Stable cardiomegaly.
